# Patient Record
Sex: Male | Born: 1960 | Race: Black or African American | Hispanic: No | Marital: Married | Smoking: Never smoker
Health system: Southern US, Community
[De-identification: ages and names within clinical notes are randomized; demographics above are authoritative.]

---

## 2003-11-14 ENCOUNTER — Ambulatory Visit: Payer: Self-pay

## 2011-02-07 ENCOUNTER — Ambulatory Visit: Payer: Self-pay

## 2011-09-25 ENCOUNTER — Ambulatory Visit: Payer: Self-pay | Admitting: Internal Medicine

## 2013-06-16 ENCOUNTER — Ambulatory Visit: Payer: Self-pay | Admitting: Emergency Medicine

## 2013-06-22 ENCOUNTER — Ambulatory Visit: Payer: Self-pay | Admitting: Podiatry

## 2014-05-10 ENCOUNTER — Ambulatory Visit
Admit: 2014-05-10 | Disposition: A | Discharge status: Home / Self Care | Payer: Self-pay | Attending: Family Medicine | Admitting: Family Medicine

## 2014-12-25 ENCOUNTER — Ambulatory Visit
Admission: EM | Admit: 2014-12-25 | Discharge: 2014-12-25 | Disposition: A | Discharge status: Home / Self Care | Payer: No Typology Code available for payment source | Attending: Family Medicine | Admitting: Family Medicine

## 2014-12-25 ENCOUNTER — Encounter: Payer: Self-pay | Admitting: Emergency Medicine

## 2014-12-25 DIAGNOSIS — J069 Acute upper respiratory infection, unspecified: Secondary | ICD-10-CM | POA: Diagnosis not present

## 2014-12-25 DIAGNOSIS — B9789 Other viral agents as the cause of diseases classified elsewhere: Principal | ICD-10-CM

## 2014-12-25 MED ORDER — BENZONATATE 200 MG PO CAPS: 200.0000 mg | ORAL_CAPSULE | Freq: Three times a day (TID) | ORAL | Status: AC | PRN

## 2014-12-25 MED ORDER — GUAIFENESIN-CODEINE 100-10 MG/5ML PO SOLN: 10.0000 mL | Freq: Four times a day (QID) | ORAL | Status: AC | PRN

## 2014-12-25 MED ORDER — ALBUTEROL SULFATE HFA 108 (90 BASE) MCG/ACT IN AERS
1.0000 | INHALATION_SPRAY | Freq: Four times a day (QID) | RESPIRATORY_TRACT | Status: AC | PRN
Start: 2014-12-25 — End: ?

## 2014-12-25 NOTE — ED Notes (Signed)
Patient c/o cough and chest congestion since Thursday.

## 2015-01-08 ENCOUNTER — Encounter: Payer: Self-pay | Admitting: Emergency Medicine

## 2015-01-08 ENCOUNTER — Ambulatory Visit
Admission: EM | Admit: 2015-01-08 | Discharge: 2015-01-08 | Disposition: A | Discharge status: Home / Self Care | Payer: 59 | Attending: Family Medicine | Admitting: Family Medicine

## 2015-01-08 ENCOUNTER — Ambulatory Visit (INDEPENDENT_AMBULATORY_CARE_PROVIDER_SITE_OTHER): Payer: 59

## 2015-01-08 DIAGNOSIS — M79604 Pain in right leg: Secondary | ICD-10-CM

## 2015-01-08 DIAGNOSIS — M7071 Other bursitis of hip, right hip: Secondary | ICD-10-CM

## 2015-01-08 DIAGNOSIS — M1990 Unspecified osteoarthritis, unspecified site: Secondary | ICD-10-CM

## 2015-01-08 MED ORDER — MELOXICAM 15 MG PO TABS: 15.0000 mg | ORAL_TABLET | Freq: Every day | ORAL | Status: AC | PRN

## 2015-01-08 MED ORDER — OXYCODONE-ACETAMINOPHEN 5-325 MG PO TABS: 1.0000 | ORAL_TABLET | Freq: Three times a day (TID) | ORAL | Status: AC | PRN

## 2015-01-08 NOTE — ED Provider Notes (Signed)
[facility]  ____________________________________________  Time seen: Approximately 12:26 PM  I have reviewed the triage vital signs and the nursing notes.   HISTORY  Chief Complaint Leg Pain  HPI Benjamin Velasquez is a 54 y.o. male presents for the complaint of right hip pain for the last 3-4 months. Patient states that pain is not constant but pain is primarily with movement and activity. States if he lies down he has no pain unless lying directly on right hip. States when sitting still and sitting in a comfortable position he does not have any pain.  Denies fall or trauma. Reports has remained active. Reports plays basketball at least twice a week. States frequently walks at work and at home. Denies history of similar in the past. Patient also reports in addition to right hip pain he intermittently has right anterior thigh pain and reports that he notices that pain present when his hip bothers him more. Denies pain radiation. Denies numbness or tingling sensation. Denies leg swelling. Denies recent immobilization, long trips or recent surgeries.Denies neck or back pain. Denies dysuria or groin pain.   States current right hip pain is 6 out of 10 aching and throbbing, worse with movement and improves with rest.   History reviewed. No pertinent past medical history.  There are no active problems to display for this patient.   History reviewed. No pertinent past surgical history.  Current Outpatient Rx  Name  Route  Sig  Dispense  Refill  .           .           .             Allergies Review of patient's allergies indicates no known allergies.  History reviewed. No pertinent family history.  Social History Social History  Substance Use Topics  . Smoking status: Never Smoker   . Smokeless tobacco: None  . Alcohol Use: No    Review of Systems Constitutional: No fever/chills Eyes: No visual changes. ENT: No sore throat. Cardiovascular: Denies chest  pain. Respiratory: Denies shortness of breath. Gastrointestinal: No abdominal pain.  No nausea, no vomiting.  No diarrhea.  No constipation. Genitourinary: Negative for dysuria. Musculoskeletal: Negative for back pain.right hip pain.  Skin: Negative for rash. Neurological: Negative for headaches, focal weakness or numbness.  10-point ROS otherwise negative.  ____________________________________________   PHYSICAL EXAM:  VITAL SIGNS: ED Triage Vitals  Enc Vitals Group     BP 01/08/15 1147 124/91 mmHg     Pulse Rate 01/08/15 1147 67     Resp 01/08/15 1147 16     Temp 01/08/15 1147 96.4 F (35.8 C)     Temp Source 01/08/15 1147 Tympanic     SpO2 01/08/15 1147 98 %     Weight 01/08/15 1147 195 lb (88.451 kg)     Height 01/08/15 1147 5' 11.5" (1.816 m)     Head Cir --      Peak Flow --      Pain Score 01/08/15 1149 8     Pain Loc --      Pain Edu? --      Excl. in GC? --     Constitutional: Alert and oriented. Well appearing and in no acute distress. Eyes: Conjunctivae are normal. PERRL. EOMI. Head: Atraumatic.  Nose: No congestion/rhinnorhea.  Mouth/Throat: Mucous membranes are moist.  Oropharynx non-erythematous. Neck: No stridor.  No cervical spine tenderness to palpation. Hematological/Lymphatic/Immunilogical: No cervical lymphadenopathy. Cardiovascular: Normal rate, regular  rhythm. Grossly normal heart sounds.  Good peripheral circulation. Respiratory: Normal respiratory effort.  No retractions. Lungs CTAB. Gastrointestinal: Soft and nontender.Normal Bowel sounds. No CVA tenderness. Musculoskeletal: No lower or upper extremity tenderness nor edema. Bilateral pedal pulses equal and easily palpated. No cervical, thoracic or lumbar tenderness to palpation. Except: Right hip at right greater trochanter moderate tenderness to palpation point tenderness, pain increases with hip flexion as well as hip abduction, full range of motion present, no swelling, no ecchymosis. Right  quadriceps nontender to palpation. Right lower extremity otherwise nontender to palpation, no swelling, ecchymosis. Bilateral pedal pulses equal and easily palpated. No calf tenderness bilaterally. Steady gait in room, mild antalgic gait. Changes position quickly without discomfort or distress. No saddle anesthesia. Neurologic:  Normal speech and language. No gross focal neurologic deficits are appreciated. Skin:  Skin is warm, dry and intact. No rash noted. Psychiatric: Mood and affect are normal. Speech and behavior are normal.  ____________________________________________   LABS (all labs ordered are listed, but only abnormal results are displayed)  Labs Reviewed - No data to display  RADIOLOGY  EXAM: RIGHT FEMUR 2 VIEWS  COMPARISON: No priors.  FINDINGS: Four views of the right femur demonstrate no acute displaced fracture. There is a cortical irregularity of the lateral aspect of the proximal third of the femoral diaphysis where there is a small bony protruberance. Right hip joint is remarkable for severe joint space narrowing, subchondral sclerosis, subchondral cyst formation and osteophyte formation, compatible with advanced osteoarthritis.  IMPRESSION: 1. No acute radiographic abnormality of the right femur. 2. Severe right hip joint osteoarthritis. 3. Irregularity of the lateral aspect of the proximal third of the femoral diaphysis favored to either represent an exostosis, or sequela of prior tug injury (both of which are benign and not likely to be a source of symptoms).   Electronically Signed By: Trudie Reedaniel Entrikin M.D. On: 01/08/2015 13:34          DG Pelvis 1-2 Views (Final result) Result time: 01/08/15 13:07:20   Final result by Rad Results In Interface (01/08/15 13:07:20)   Narrative:   CLINICAL DATA: Right posterior hip pain and anterior lateral femur pain for 3-4 months. No known injury.  EXAM: PELVIS - 1-2 VIEW  COMPARISON:  None.  FINDINGS: Advanced degenerative changes within both hips with near complete joint space loss, exuberant osteophyte formation and sub chondral sclerosis. No acute bony abnormality. Specifically, no fracture, subluxation, or dislocation. Soft tissues are intact.  IMPRESSION: Advanced degenerative changes bilaterally. No acute bony abnormality.   Electronically Signed By: Charlett NoseKevin Dover M.D. On: 01/08/2015 13:07      I, Renford DillsLindsey Shatima Zalar, personally viewed and evaluated these images (plain radiographs) as part of my medical decision making.    INITIAL IMPRESSION / ASSESSMENT AND PLAN / ED COURSE  Pertinent labs & imaging results that were available during my care of the patient were reviewed by me and considered in my medical decision making (see chart for details).  Very well-appearing patient. No acute distress. Presents for the complaint of right hip pain 3-4 months. Also reports intermittent right anterior quadricep pain. No palpable tenderness to right quadricep or right femur however point moderate tenderness to right greater trochanter. Right greater trochanter pain with flexion as well as abduction, no swelling or ecchymosis. Suspect right hip bursitis. Also suspect intermittent secondary right anterior quadricep strain secondary to guarding right hip per patient report, no quadricep tenderness or femur tenderness at this time. Full range of motion present. Will evaluate by  x-ray. Denies trauma or direct injury.  Right femur no acute radiographic abnormality, severe right hip joint osteoarthritis, irregularity of the lateral aspect of the proximal third of the femoral diaphysis favored to either represent exostosis or sequela of prior tug injury per radiology. Pelvis per radiology advanced degenerative changes bilaterally, no acute bony abnormality.  Patient with noted osteoarthritis on x-rays. No acute bony modality. Suspect right greater trochanter bursitis. Will treat  with oral Mobic and when necessary Percocet. Counseled regarding rest, ice, stretching as well as avoidance of hard impact activities. Information for orthopedic on call Dr. Joice Lofts given. Patient follow-up with primary care physician and orthopedic.  Discussed follow up with Primary care physician this week. Discussed follow up and return parameters including no resolution or any worsening concerns. Patient verbalized understanding and agreed to plan.   ____________________________________________   FINAL CLINICAL IMPRESSION(S) / ED DIAGNOSES  Final diagnoses:  Hip bursitis, right  Right leg pain  Osteoarthritis, unspecified osteoarthritis type, unspecified site       Renford Dills, NP 01/08/15 1533

## 2015-01-08 NOTE — ED Notes (Signed)
Patient c/o pain in his right thigh and right hip for months.  Patient denies injury.

## 2015-01-08 NOTE — Discharge Instructions (Signed)
Take medication as prescribed. Rest. Avoid strenuous activity.   Follow up with your primary care physician. Follow up with orthopedic above as discussed. Return to Urgent care for new or worsening concerns.   Hip Bursitis Bursitis is a swelling and soreness (inflammation) of a fluid-filled sac (bursa). This sac overlies and protects the joints.  CAUSES   Injury.  Overuse of the muscles surrounding the joint.  Arthritis.  Gout.  Infection.  Cold weather.  Inadequate warm-up and conditioning prior to activities. The cause may not be known.  SYMPTOMS   Mild to severe irritation.  Tenderness and swelling over the outside of the hip.  Pain with motion of the hip.  If the bursa becomes infected, a fever may be present. Redness, tenderness, and warmth will develop over the hip. Symptoms usually lessen in 3 to 4 weeks with treatment, but can come back. TREATMENT If conservative treatment does not work, your caregiver may advise draining the bursa and injecting cortisone into the area. This may speed up the healing process. This may also be used as an initial treatment of choice. HOME CARE INSTRUCTIONS   Apply ice to the affected area for 15-20 minutes every 3 to 4 hours while awake for the first 2 days. Put the ice in a plastic bag and place a towel between the bag of ice and your skin.  Rest the painful joint as much as possible, but continue to put the joint through a normal range of motion at least 4 times per day. When the pain lessens, begin normal, slow movements and usual activities to help prevent stiffness of the hip.  Only take over-the-counter or prescription medicines for pain, discomfort, or fever as directed by your caregiver.  Use crutches to limit weight bearing on the hip joint, if advised.  Elevate your painful hip to reduce swelling. Use pillows for propping and cushioning your legs and hips.  Gentle massage may provide comfort and decrease swelling. SEEK  IMMEDIATE MEDICAL CARE IF:   Your pain increases even during treatment, or you are not improving.  You have a fever.  You have heat and inflammation over the involved bursa.  You have any other questions or concerns. MAKE SURE YOU:   Understand these instructions.  Will watch your condition.  Will get help right away if you are not doing well or get worse.   This information is not intended to replace advice given to you by your health care provider. Make sure you discuss any questions you have with your health care provider.   Document Released: 07/05/2001 Document Revised: 04/07/2011 Document Reviewed: 08/15/2014 Elsevier Interactive Patient Education 2016 Else[employer] Inc[employer]  Musculoskeletal Pain Musculoskeletal pain is muscle and boney aches and pains. These pains can occur in any part of the body. Your caregiver may treat you without knowing the cause of the pain. They may treat you if blood or urine tests, X-rays, and other tests were normal.  CAUSES There is often not a definite cause or reason for these pains. These pains may be caused by a type of germ (virus). The discomfort may also come from overuse. Overuse includes working out too hard when your body is not fit. Boney aches also come from weather changes. Bone is sensitive to atmospheric pressure changes. HOME CARE INSTRUCTIONS   Ask when your test results will be ready. Make sure you get your test results.  Only take over-the-counter or prescription medicines for pain, discomfort, or fever as directed by your caregiver. If  you were given medications for your condition, do not drive, operate machinery or power tools, or sign legal documents for 24 hours. Do not drink alcohol. Do not take sleeping pills or other medications that may interfere with treatment.  Continue all activities unless the activities cause more pain. When the pain lessens, slowly resume normal activities. Gradually increase the intensity and duration of  the activities or exercise.  During periods of severe pain, bed rest may be helpful. Lay or sit in any position that is comfortable.  Putting ice on the injured area.  Put ice in a bag.  Place a towel between your skin and the bag.  Leave the ice on for 15 to 20 minutes, 3 to 4 times a day.  Follow up with your caregiver for continued problems and no reason can be found for the pain. If the pain becomes worse or does not go away, it may be necessary to repeat tests or do additional testing. Your caregiver may need to look further for a possible cause. SEEK IMMEDIATE MEDICAL CARE IF:  You have pain that is getting worse and is not relieved by medications.  You develop chest pain that is associated with shortness or breath, sweating, feeling sick to your stomach (nauseous), or throw up (vomit).  Your pain becomes localized to the abdomen.  You develop any new symptoms that seem different or that concern you. MAKE SURE YOU:   Understand these instructions.  Will watch your condition.  Will get help right away if you are not doing well or get worse.   This information is not intended to replace advice given to you by your health care provider. Make sure you discuss any questions you have with your health care provider.   Document Released: 01/13/2005 Document Revised: 04/07/2011 Document Reviewed: 09/17/2012 Elsevier Interactive Patient Education [employer] Elsevier Inc.

## 2015-01-18 NOTE — ED Provider Notes (Signed)
CSN: [id number]     Arrival date & time 12/25/14  1915 History   First MD Initiated Contact with Patient 12/25/14 2006     Chief Complaint  Patient presents with  . Cough   (Consider location/radiation/quality/duration/timing/severity/associated sxs/prior Treatment) Patient is a 54 y.o. male presenting with cough.  Cough Cough characteristics:  Non-productive Severity:  Moderate Onset quality:  Sudden Duration:  5 days Timing:  Constant Progression:  Worsening Associated symptoms: rhinorrhea and wheezing   Associated symptoms: no chest pain, no chills, no diaphoresis, no ear fullness, no ear pain, no eye discharge, no shortness of breath and no sinus congestion     History reviewed. No pertinent past medical history. History reviewed. No pertinent past surgical history. History reviewed. No pertinent family history. Social History  Substance Use Topics  . Smoking status: Never Smoker   . Smokeless tobacco: None  . Alcohol Use: No    Review of Systems  Constitutional: Negative for chills and diaphoresis.  HENT: Positive for rhinorrhea. Negative for ear pain.   Eyes: Negative for discharge.  Respiratory: Positive for cough and wheezing. Negative for shortness of breath.   Cardiovascular: Negative for chest pain.    Allergies  Review of patient's allergies indicates no known allergies.  Home Medications   Prior to Admission medications   Medication Sig Start Date End Date Taking? Authorizing Provider  albuterol (PROVENTIL HFA;VENTOLIN HFA) 108 (90 BASE) MCG/ACT inhaler Inhale 1-2 puffs into the lungs every 6 (six) hours as needed for wheezing or shortness of breath. 12/25/14   Payton Mccallumrlando Santosh Petter, MD  benzonatate (TESSALON) 200 MG capsule Take 1 capsule (200 mg total) by mouth 3 (three) times daily as needed for cough. 12/25/14   Payton Mccallumrlando Sameka Bagent, MD  guaiFENesin-codeine 100-10 MG/5ML syrup Take 10 mLs by mouth every 6 (six) hours as needed for cough. 12/25/14   Payton Mccallumrlando Kirra Verga, MD   meloxicam (MOBIC) 15 MG tablet Take 1 tablet (15 mg total) by mouth daily as needed for pain. 01/08/15   Renford DillsLindsey Miller, NP  oxyCODONE-acetaminophen (ROXICET) 5-325 MG tablet Take 1 tablet by mouth every 8 (eight) hours as needed for moderate pain or severe pain (Do not drive or operate heavy machinery while taking as can cause drowsiness.). 01/08/15   Renford DillsLindsey Miller, NP   Meds Ordered and Administered this Visit  Medications - No data to display  BP 159/100 mmHg  Pulse 63  Temp(Src) 97.4 F (36.3 C) (Tympanic)  Resp 16  Ht 5' 11.5" (1.816 m)  Wt 198 lb (89.812 kg)  BMI 27.23 kg/m2  SpO2 99% No data found.   Physical Exam  Constitutional: He appears well-developed and well-nourished. No distress.  HENT:  Head: Normocephalic and atraumatic.  Right Ear: Tympanic membrane, external ear and ear canal normal.  Left Ear: Tympanic membrane, external ear and ear canal normal.  Nose: Nose normal.  Mouth/Throat: Uvula is midline, oropharynx is clear and moist and mucous membranes are normal. No oropharyngeal exudate or tonsillar abscesses.  Eyes: Conjunctivae and EOM are normal. Pupils are equal, round, and reactive to light. Right eye exhibits no discharge. Left eye exhibits no discharge. No scleral icterus.  Neck: Normal range of motion. Neck supple. No tracheal deviation present. No thyromegaly present.  Cardiovascular: Normal rate, regular rhythm and normal heart sounds.   Pulmonary/Chest: Effort normal and breath sounds normal. No stridor. No respiratory distress. He has no wheezes. He has no rales. He exhibits no tenderness.  Lymphadenopathy:    He has no cervical adenopathy.  Neurological: He is alert.  Skin: Skin is warm and dry. No rash noted. He is not diaphoretic.  Nursing note and vitals reviewed.   ED Course  Procedures (including critical care time)  Labs Review Labs Reviewed - No data to display  Imaging Review No results found.   Visual Acuity Review  Right  Eye Distance:   Left Eye Distance:   Bilateral Distance:    Right Eye Near:   Left Eye Near:    Bilateral Near:         MDM   1. Viral URI with cough    Discharge Medication List as of 12/25/2014  8:25 PM    START taking these medications   Details  albuterol (PROVENTIL HFA;VENTOLIN HFA) 108 (90 BASE) MCG/ACT inhaler Inhale 1-2 puffs into the lungs every 6 (six) hours as needed for wheezing or shortness of breath., Starting 12/25/2014, Until Discontinued, Print    benzonatate (TESSALON) 200 MG capsule Take 1 capsule (200 mg total) by mouth 3 (three) times daily as needed for cough., Starting 12/25/2014, Until Discontinued, Print    guaiFENesin-codeine 100-10 MG/5ML syrup Take 10 mLs by mouth every 6 (six) hours as needed for cough., Starting 12/25/2014, Until Discontinued, Print       1.  diagnosis reviewed with patient 2. rx as per orders above; reviewed possible side effects, interactions, risks and benefits  3. Recommend supportive treatment with rest, increased fluids, otc meds 4. Follow-up prn if symptoms worsen or don't improve    Payton Mccallum, MD 01/18/15 1941

## 2015-07-24 IMAGING — CR RIGHT ANKLE - COMPLETE 3+ VIEW
1 series · 3 of 3 positions shown · non-contrast
Comparison: None.

CLINICAL DATA: Ankle and Achilles region pain status post injury

EXAM:
RIGHT ANKLE - COMPLETE 3+ VIEW

[Series 1: ap · 0.17mm/px · 3 of 3 slices shown]
[im 1/3]
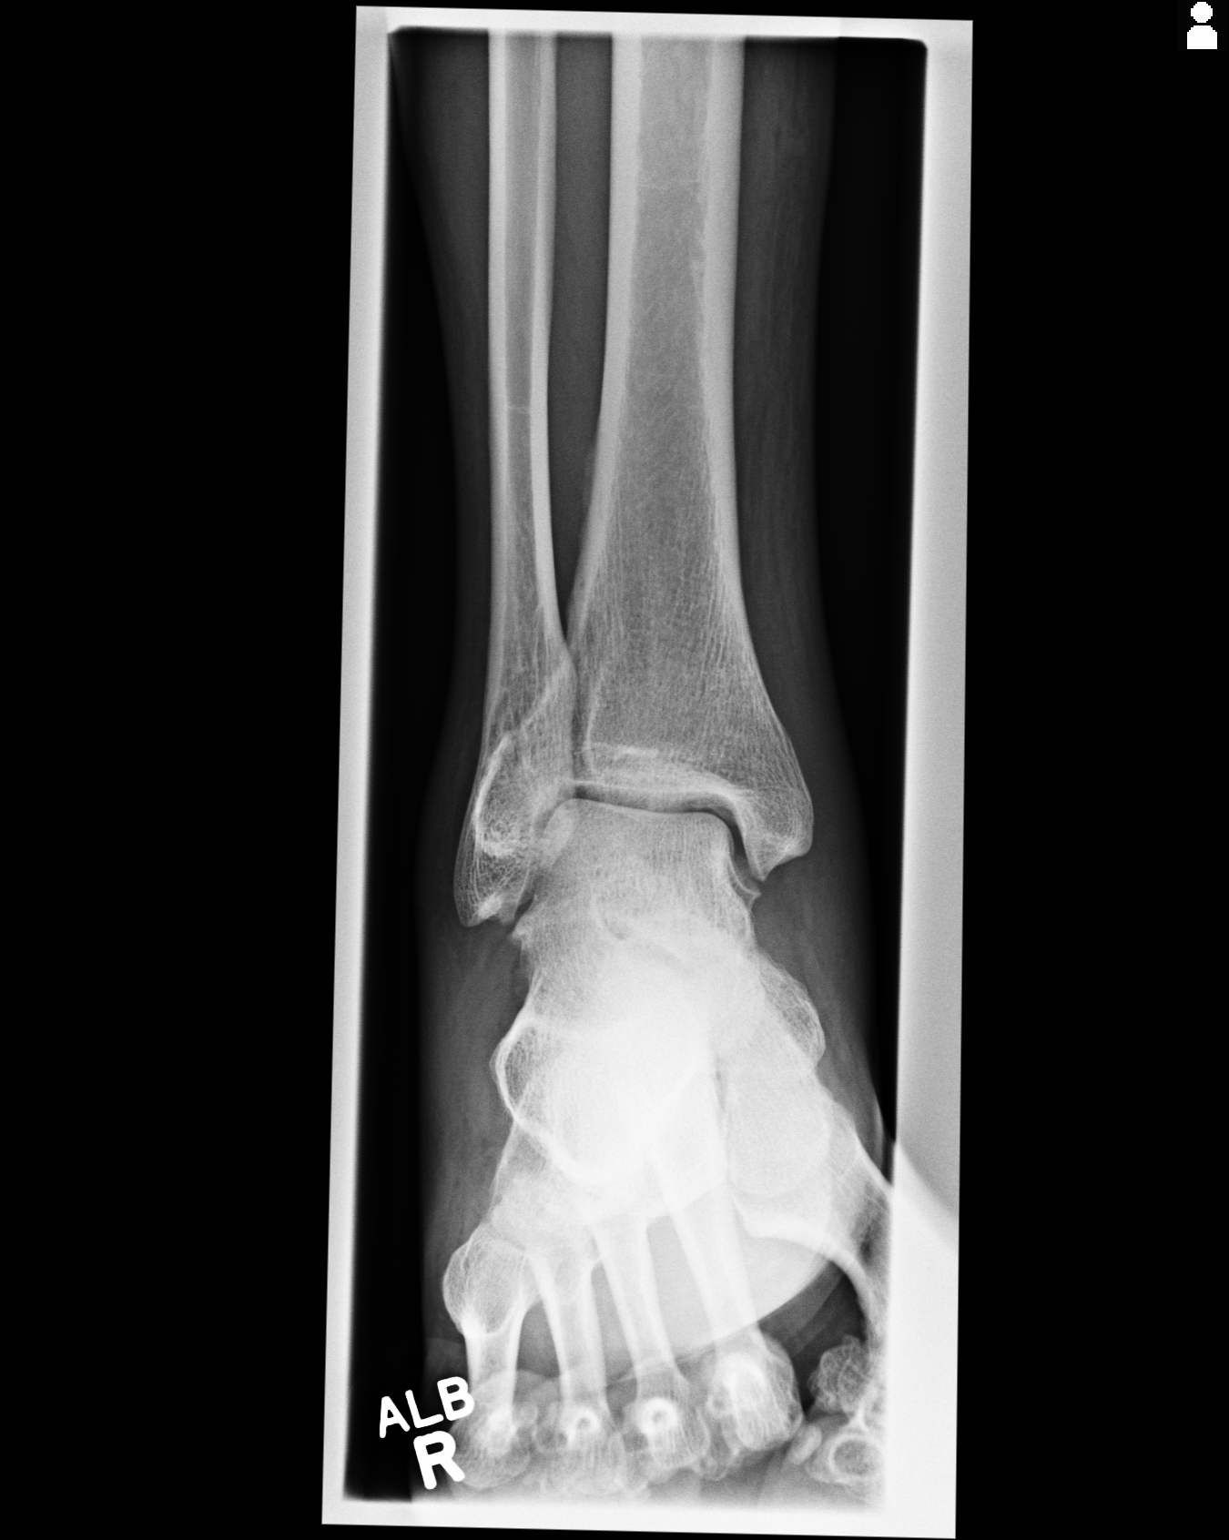
[im 2/3]
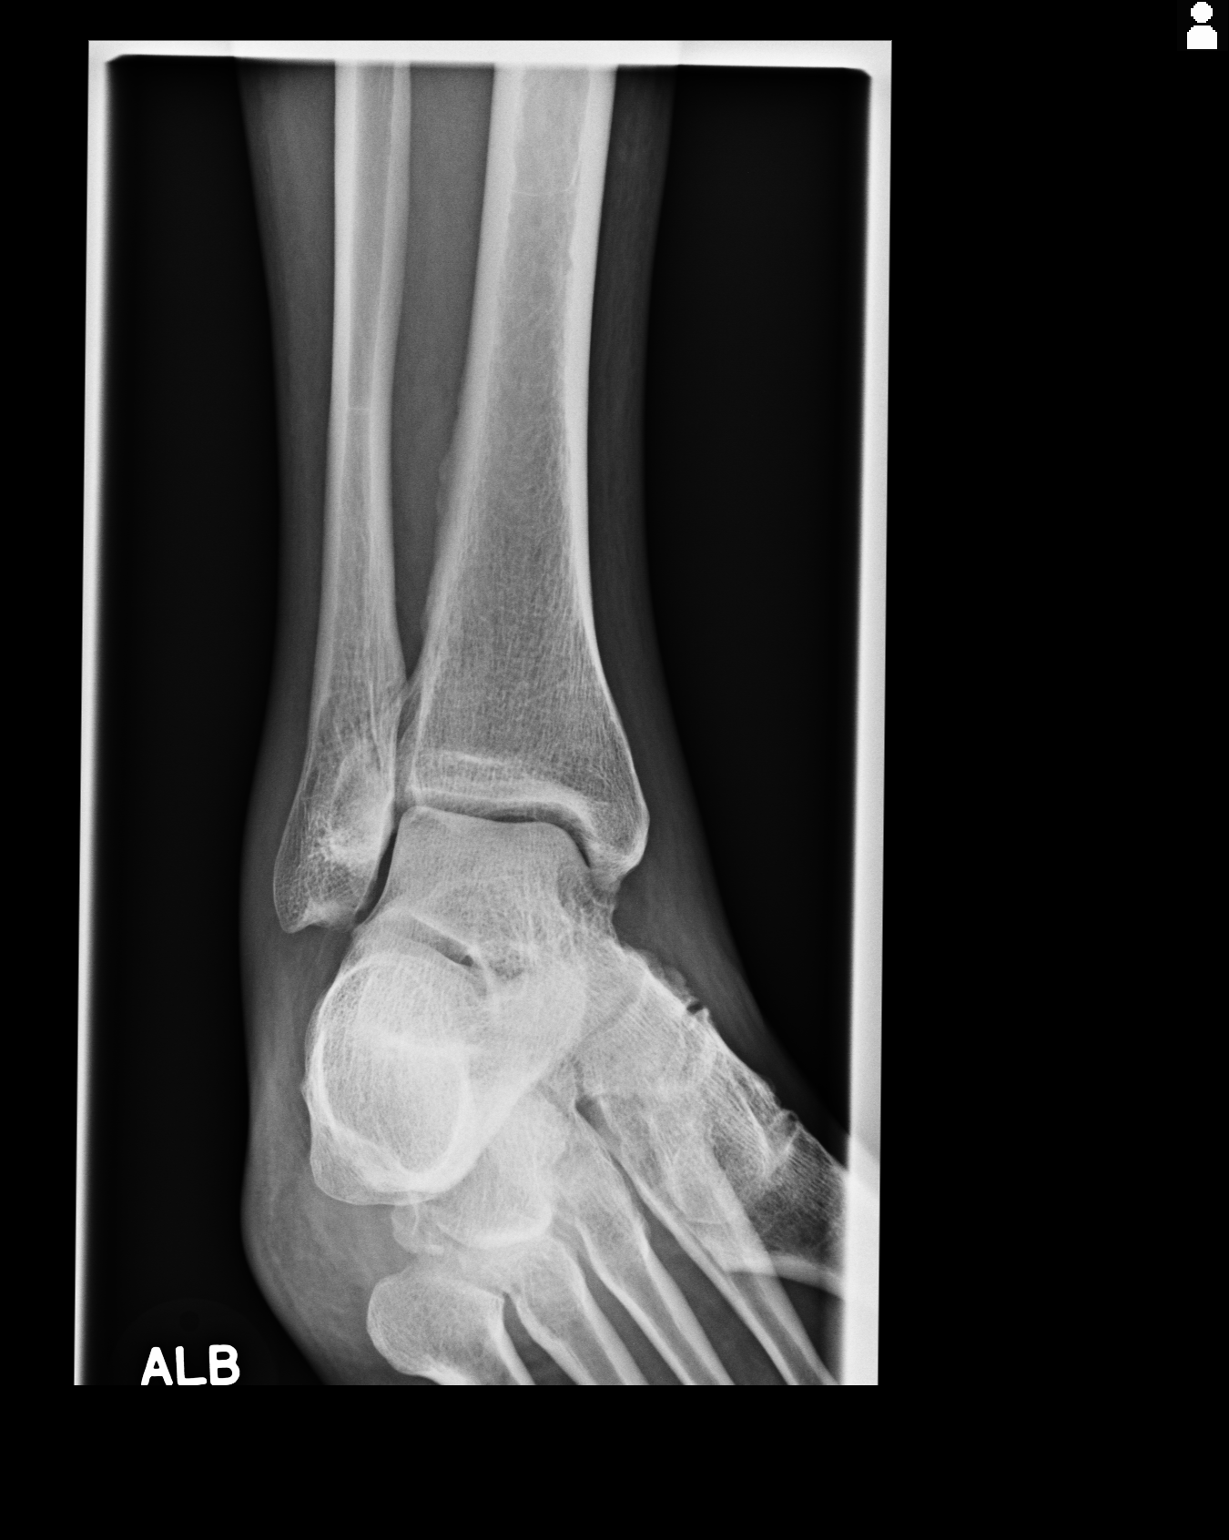
[im 3/3]
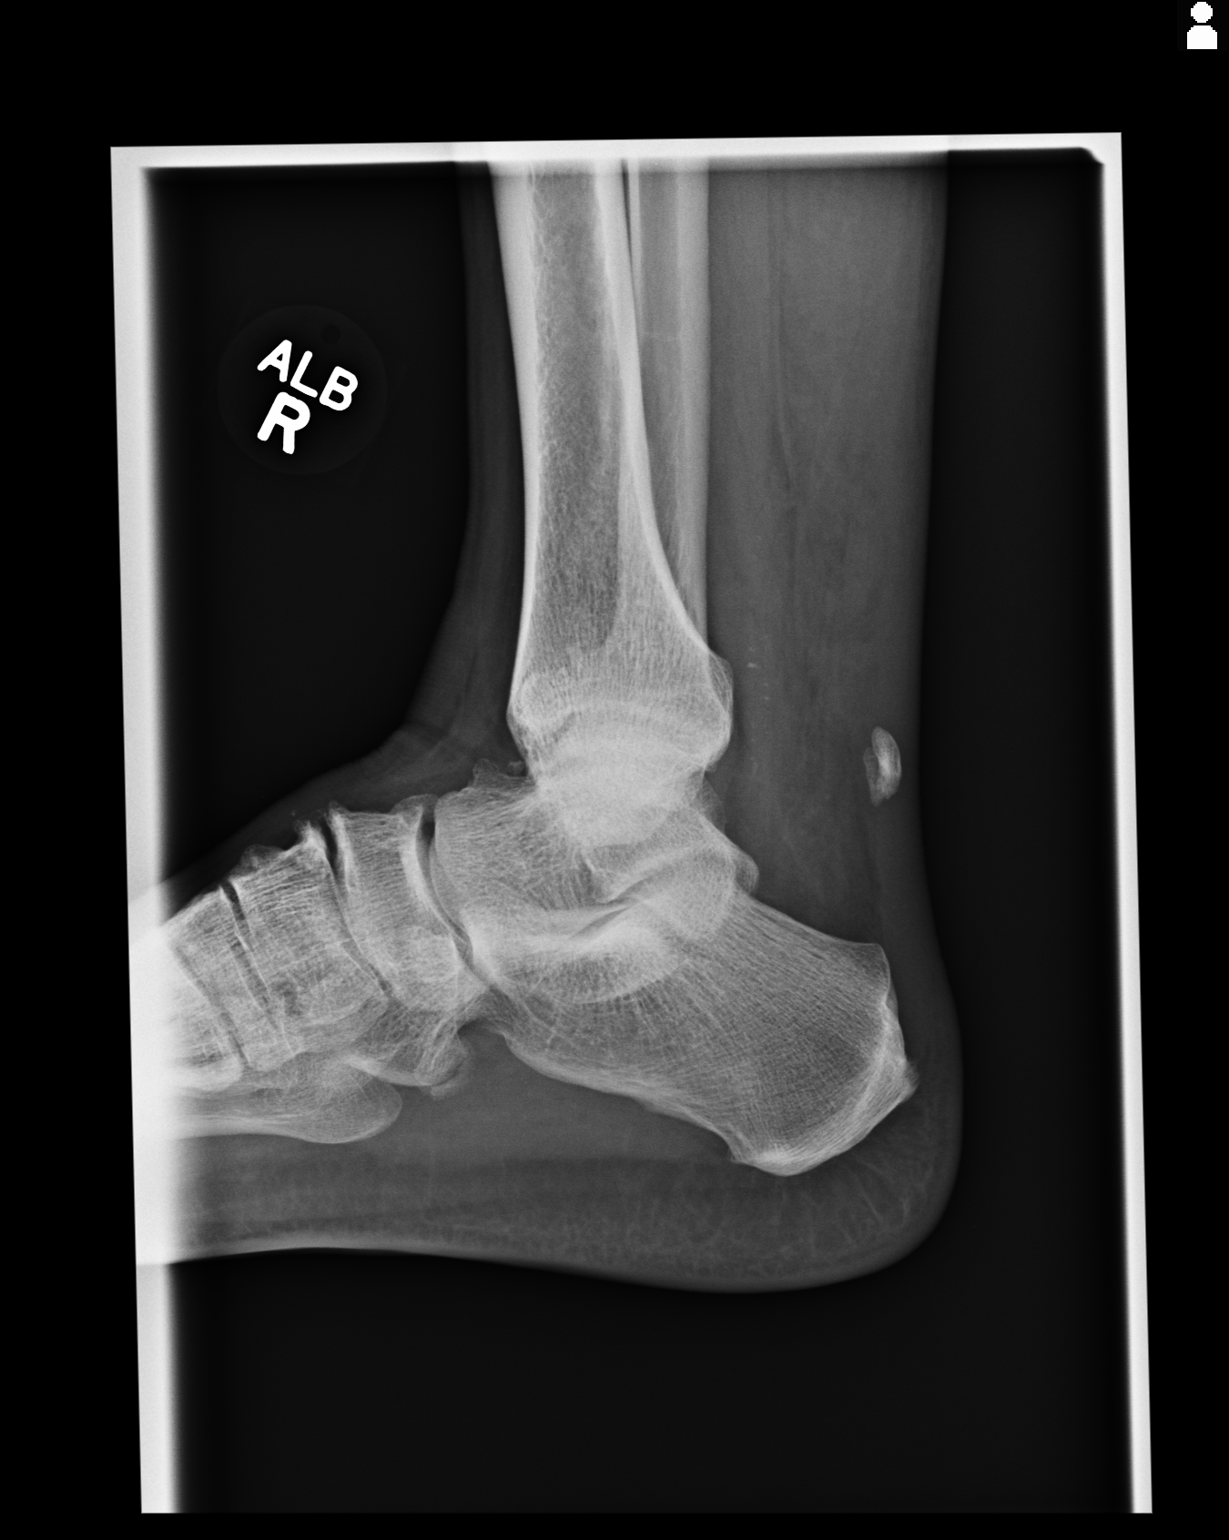

[3 of 3 positions shown; findings below may reference images not displayed]

FINDINGS: The joint mortise is preserved. The talar dome is intact. Calcific
density lies in the region of the Achilles tendon that is of
uncertain etiology. There is soft tissue thickening of the lower leg
and posterior ankle region. The talus and calcaneus appear intact.
IMPRESSION: Abnormal soft tissue density with calcification is present in the
Achilles region of the posterior ankle. No definite donor site of
this suspected avulsion fracture fragment is demonstrated. Followup
MRI would be useful to assess the soft tissues of the Achilles
region.

## 2019-03-03 ENCOUNTER — Ambulatory Visit: Payer: BC Managed Care – PPO | Attending: Internal Medicine

## 2019-03-03 DIAGNOSIS — Z23 Encounter for immunization: Secondary | ICD-10-CM

## 2019-03-03 NOTE — Progress Notes (Signed)
   Covid-19 Vaccination Clinic  Name:  Fredie Majano [facility]    MRN: [id number] DOB: [birth year]  03/03/2019  Mr. Langhans was observed post Covid-19 immunization for 15 minutes without incidence. He was provided with Vaccine Information Sheet and instruction to access the V-Safe system.   Mr. Kozak was instructed to call 911 with any severe reactions post vaccine: Marland Kitchen Difficulty breathing  . Swelling of your face and throat  . A fast heartbeat  . A bad rash all over your body  . Dizziness and weakness    Immunizations Administered    Name Date Dose VIS Date Route   Pfizer COVID-19 Vaccine 03/03/2019 11:37 AM 0.3 mL 01/07/2019 Intramuscular   Manufacturer: [employer], [employer]   Lot: [id number]   NDC: [address]-0

## 2019-03-29 ENCOUNTER — Ambulatory Visit: Payer: BC Managed Care – PPO | Attending: Internal Medicine

## 2019-03-29 DIAGNOSIS — Z23 Encounter for immunization: Secondary | ICD-10-CM | POA: Insufficient documentation

## 2019-03-29 NOTE — Progress Notes (Signed)
   Covid-19 Vaccination Clinic  Name:  Daiquan Resnik [facility]    MRN: [id number] DOB: 05-18-1960  03/29/2019  Mr. Seybold was observed post Covid-19 immunization for 15 minutes without incident. He was provided with Vaccine Information Sheet and instruction to access the V-Safe system.   Mr. Caudill was instructed to call 911 with any severe reactions post vaccine: Marland Kitchen Difficulty breathing  . Swelling of face and throat  . A fast heartbeat  . A bad rash all over body  . Dizziness and weakness   Immunizations Administered    Name Date Dose VIS Date Route   Pfizer COVID-19 Vaccine 03/29/2019 10:03 AM 0.3 mL 01/07/2019 Intramuscular   Manufacturer: [employer], [employer]   Lot: [id number]   NDC: [address]-1
